# Patient Record
Sex: Male | Born: 2000 | Race: Black or African American | Hispanic: No | Marital: Single | Smoking: Never smoker
Health system: Southern US, Community
[De-identification: ages and names within clinical notes are randomized; demographics above are authoritative.]

## PROBLEM LIST (undated history)

## (undated) DIAGNOSIS — T7840XA Allergy, unspecified, initial encounter: Secondary | ICD-10-CM

## (undated) HISTORY — DX: Allergy, unspecified, initial encounter: T78.40XA

---
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
## 2020-01-10 ENCOUNTER — Ambulatory Visit (INDEPENDENT_AMBULATORY_CARE_PROVIDER_SITE_OTHER): Payer: Commercial Managed Care - PPO

## 2020-01-10 ENCOUNTER — Ambulatory Visit (HOSPITAL_COMMUNITY)
Admission: EM | Admit: 2020-01-10 | Discharge: 2020-01-10 | Disposition: A | Discharge status: Home / Self Care | Payer: Commercial Managed Care - PPO | Attending: Emergency Medicine | Admitting: Emergency Medicine

## 2020-01-10 ENCOUNTER — Other Ambulatory Visit: Payer: Self-pay

## 2020-01-10 ENCOUNTER — Encounter (HOSPITAL_COMMUNITY): Payer: Self-pay

## 2020-01-10 DIAGNOSIS — R079 Chest pain, unspecified: Secondary | ICD-10-CM | POA: Insufficient documentation

## 2020-01-10 DIAGNOSIS — R0789 Other chest pain: Secondary | ICD-10-CM | POA: Diagnosis not present

## 2020-01-10 LAB — TSH: TSH: 1.691 u[IU]/mL (ref 0.350–4.500)

## 2020-01-10 MED ORDER — NAPROXEN 500 MG PO TABS: 500.0000 mg | ORAL_TABLET | Freq: Two times a day (BID) | ORAL | 0 refills | Status: DC

## 2020-01-10 MED ORDER — FAMOTIDINE 20 MG PO TABS: 20.0000 mg | ORAL_TABLET | Freq: Two times a day (BID) | ORAL | 0 refills | Status: DC

## 2020-01-10 NOTE — ED Triage Notes (Signed)
Pt states he has chest discomfort during the day. Pt states his heart beat speeds up at night. This has been going on for 3 weeks or more.

## 2020-01-10 NOTE — ED Provider Notes (Signed)
HPI  SUBJECTIVE:  Kevin Macdonald is a 19 y.o. male who presents with 1 to 2 weeks of constant, daily, deep dull left-sided chest pain.  He reports occasional right-sided chest pain.  He reports palpitations primarily at night when he is lying down.  Occasionally happens during the day.  He describes it as "my heart beating hard".  This can last up to 45 minutes to an hour.  He denies worsening of his chest pain when he has them.  He is never taken his pulse when he is having these palpitations.  Patient is very active, playing soccer, and states that he is able to exercise without any problem.  No shortness of breath, fevers.  No radiation of the pain up his neck, down his arm or through to his back.  No nausea, diaphoresis.  No coughing, wheezing, shortness of breath, abdominal pain.  No lower extremity edema, calf pain, hemoptysis, surgery in the past 4 weeks, recent prolonged immobilization.  No recent viral illness, belching, water brash.  No change in his physical activity.  He has tried raising his arm and massaging the area of pain underneath his pectoral muscle and Vicks VapoRub without improvement in his symptoms.  No aggravating factors.  It is not associated with lying down, bending forward, arm movement, torso rotation.  No trauma to the chest or change in his physical activity.  He has never had symptoms like this before.  Past medical history negative for sickle cell disease, DVT, PE, hypercholesterolemia, diabetes, hypertension, coronary disease, MI, pericarditis, asthma, smoking, pneumothorax, myocarditis, cancer, hypercoagulability, hypothyroidism, anxiety, GERD.  Family history negative for early MI.  PMD: Dr[facility] Jodi Macdonald at [facility].    Past Medical History:  Diagnosis Date  . Allergy     History reviewed. No pertinent surgical history.  History reviewed. No pertinent family history.  Social History   Tobacco Use  . Smoking status: Never Smoker  . Smokeless  tobacco: Never Used  Substance Use Topics  . Alcohol use: No  . Drug use: No    No current facility-administered medications for this encounter.  Current Outpatient Medications:  .  cetirizine (ZYRTEC) 5 MG tablet, GIVE "Kevin Macdonald" 1 TABLET BY MOUTH EVERY DAY, Disp: 30 tablet, Rfl: 3 .  famotidine (PEPCID) 20 MG tablet, Take 1 tablet (20 mg total) by mouth 2 (two) times daily., Disp: 40 tablet, Rfl: 0 .  fluticasone (FLONASE) 50 MCG/ACT nasal spray, Place 2 sprays into both nostrils daily., Disp: 16 g, Rfl: 11 .  naproxen (NAPROSYN) 500 MG tablet, Take 1 tablet (500 mg total) by mouth 2 (two) times daily., Disp: 20 tablet, Rfl: 0  Allergies  Allergen Reactions  . Amoxicillin     unknown     ROS  As noted in HPI.   Physical Exam  BP 126/65 (BP Location: Right Arm)   Pulse 65   Temp 98.7 F (37.1 C)   Resp 16   SpO2 100%   Constitutional: Well developed, well nourished, no acute distress Eyes: PERRL, EOMI, conjunctiva normal bilaterally HENT: Normocephalic, atraumatic,mucus membranes moist Respiratory: Clear to auscultation bilaterally, no rales, no wheezing, no rhonchi Cardiovascular: Normal rate and rhythm, no murmurs, no gallops, no rubs no chest wall tenderness. GI: Soft, nondistended, normal bowel sounds, nontender, no rebound, no guarding no palpable masses skin: No rash, skin intact Musculoskeletal: No lower extremity edema, no calf tenderness bilaterally Endocrine: Normal size nontender thyroid. Neurologic: Alert & oriented x 3, CN III-XII grossly intact, no motor deficits,  sensation grossly intact Psychiatric: Speech and behavior appropriate   ED Course   Medications - No data to display  Orders Placed This Encounter  Procedures  . DG Chest 2 View    Standing Status:   Standing    Number of Occurrences:   1    Order Specific Question:   Reason for Exam (SYMPTOM  OR DIAGNOSIS REQUIRED)    Answer:   left sided CP  . TSH    Standing Status:   Standing     Number of Occurrences:   1  . EKG 12-Lead    Standing Status:   Standing    Number of Occurrences:   1   Results for orders placed or performed during the hospital encounter of 01/10/20 (from the past 24 hour(s))  TSH     Status: None   Collection Time: 01/10/20  5:00 PM  Result Value Ref Range   TSH 1.691 0.350 - 4.500 uIU/mL   DG Chest 2 View  Result Date: 01/10/2020 CLINICAL DATA:  Left chest pain for 1 week. EXAM: CHEST - 2 VIEW COMPARISON:  09/24/2013 FINDINGS: The lungs appear clear.  Cardiac and mediastinal contours normal. No pleural effusion identified. No fracture or acute bony finding identified, note that the left lower ribs are excluded laterally. IMPRESSION: 1.  No significant abnormality identified. 2. Please note that the width of the patient's chest relative to the image is such that small portion of the left lateral lower ribs was excluded. If the patient's left chest pain is lateral to the degree that a rib fracture in this vicinity might be suspected, then repeat radiography to include the entirety of the left lower ribs would be recommended. Electronically Signed   By: Gaylyn Rong M.D.   On: 01/10/2020 17:15    ED Clinical Impression  1. Nonspecific chest pain      ED Assessment/Plan  EKG: Sinus bradycardia, rate 55.  Right axis deviation.  No hypertrophy.  No ST-T wave changes. no Previous EKG for comparison.  Patient symptomatic while EKG was obtained.  We will check chest x-ray to rule out pneumothorax.  Doubt dissection, ACS due to duration of symptoms and the fact that his EKG was normal while he was having symptoms.  He has no other cardiac risk factors.  Modified heart score 0.  Troponin deferred today.  Myocarditis on the differential, but he has been healthy and has had no recent viral illnesses.  No evidence of pericarditis on EKG.  We will send off a TSH because of the reports of palpitations at night.  We will contact patient at [phone] or reach  out through MyChart if it comes back abnormal.  Discussed with him that I would contact him only if abnormal.  Will send home with Naprosyn and Pepcid.  He is to follow-up with his primary care physician in several days if not getting better, gave him strict ER return precautions.  Reviewed imaging independently.  Normal chest x-ray.. See radiology report for full details.  TSH normal.  Plan as above.  Discussed labs, imaging, MDM, treatment plan, and plan for follow-up with patient Discussed sn/sx that should prompt return to the ED. patient agrees with plan.   Meds ordered this encounter  Medications  . naproxen (NAPROSYN) 500 MG tablet    Sig: Take 1 tablet (500 mg total) by mouth 2 (two) times daily.    Dispense:  20 tablet    Refill:  0  . famotidine (PEPCID) 20  MG tablet    Sig: Take 1 tablet (20 mg total) by mouth 2 (two) times daily.    Dispense:  40 tablet    Refill:  0    *This clinic note was created using Scientist, clinical (histocompatibility and immunogenetics). Therefore, there may be occasional mistakes despite careful proofreading.  ?   Domenick Gong, MD 01/10/20 1836

## 2020-01-10 NOTE — Discharge Instructions (Addendum)
Your EKG and chest x-ray were normal.  Your TSH is pending.  I will reach out to you only if it is abnormal.  You will be able to see the result in mychart when it comes out later on tonight.  Try the Pepcid and the Naprosyn see if this helps.  Go immediately to the ER if your chest pain changes, gets worse, or if it goes up your arm, down your neck, through to your back, for the things that we talked about, or for any other concerns.

## 2020-03-04 IMAGING — DX DG CHEST 2V
2 series · 2 of 2 positions shown · non-contrast
Comparison: 09/24/2013

CLINICAL DATA: Left chest pain for 1 week.

EXAM:
CHEST - 2 VIEW

[chest pa]
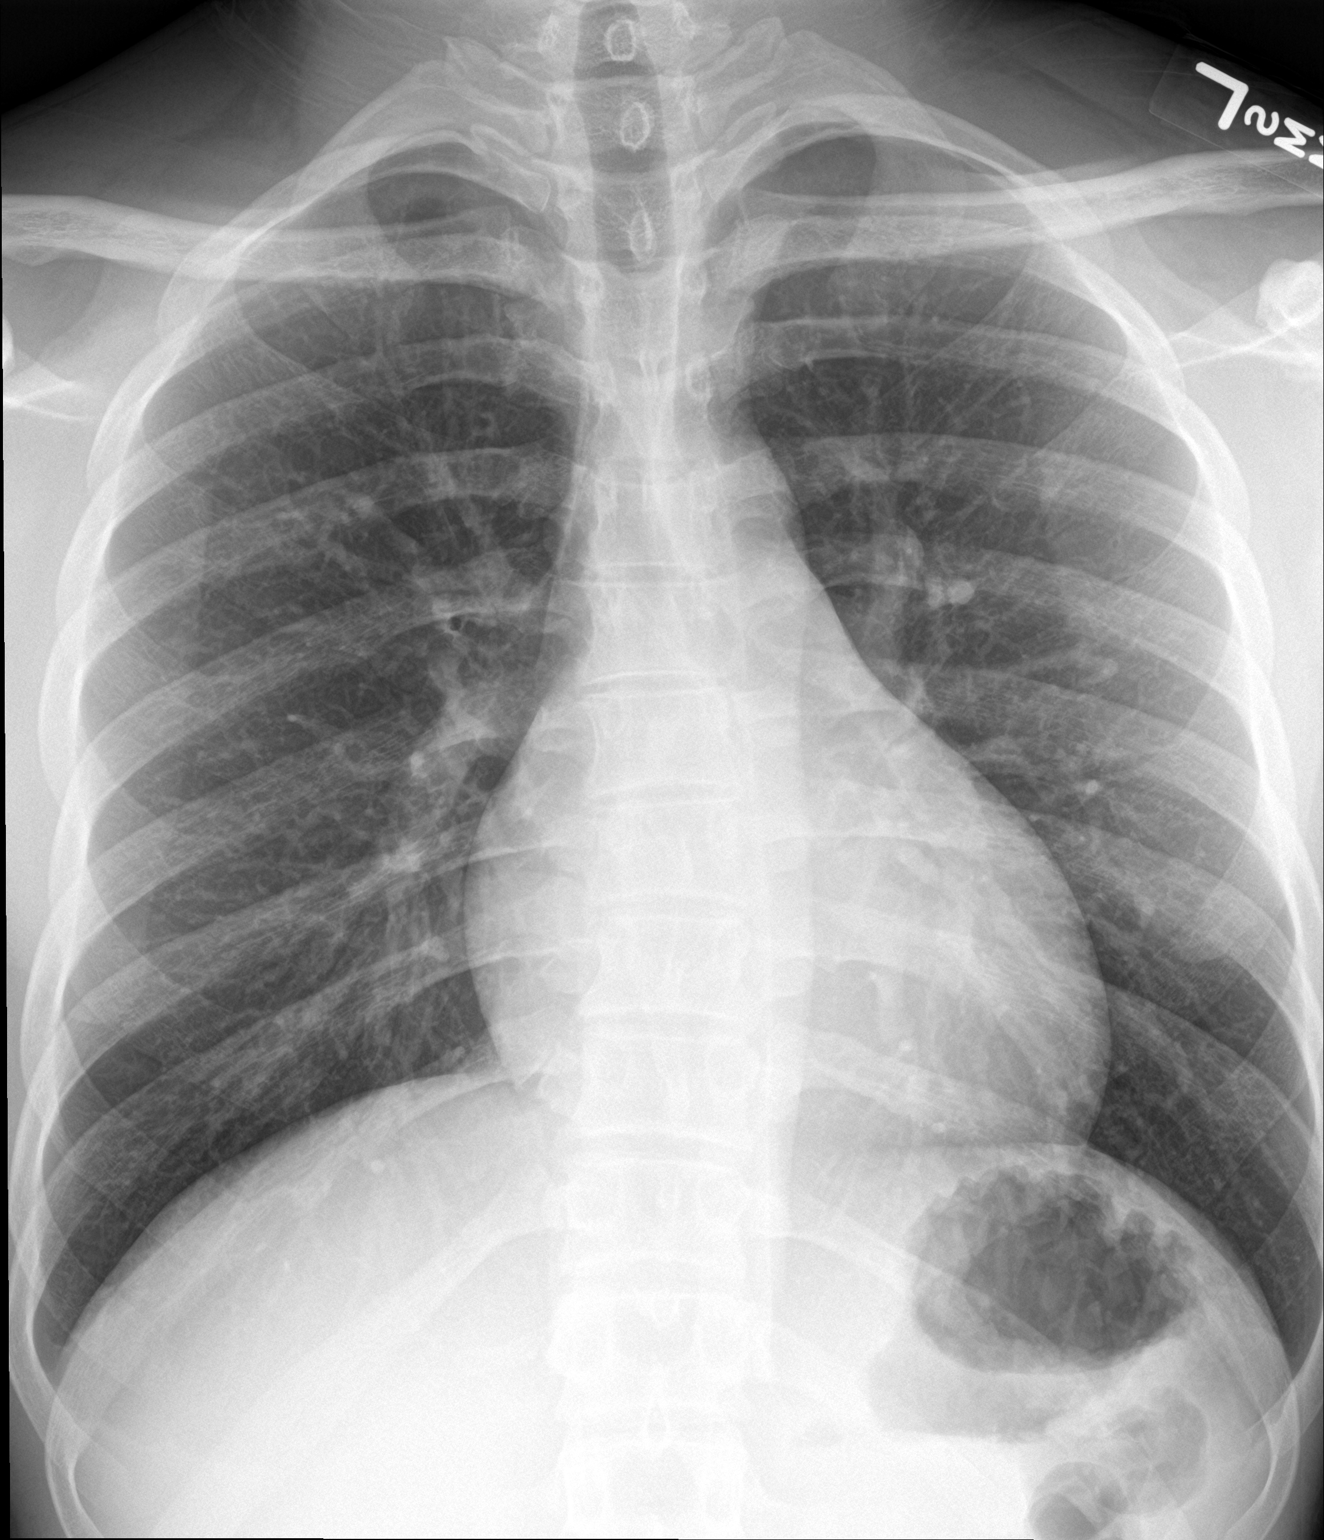

[chest lat]
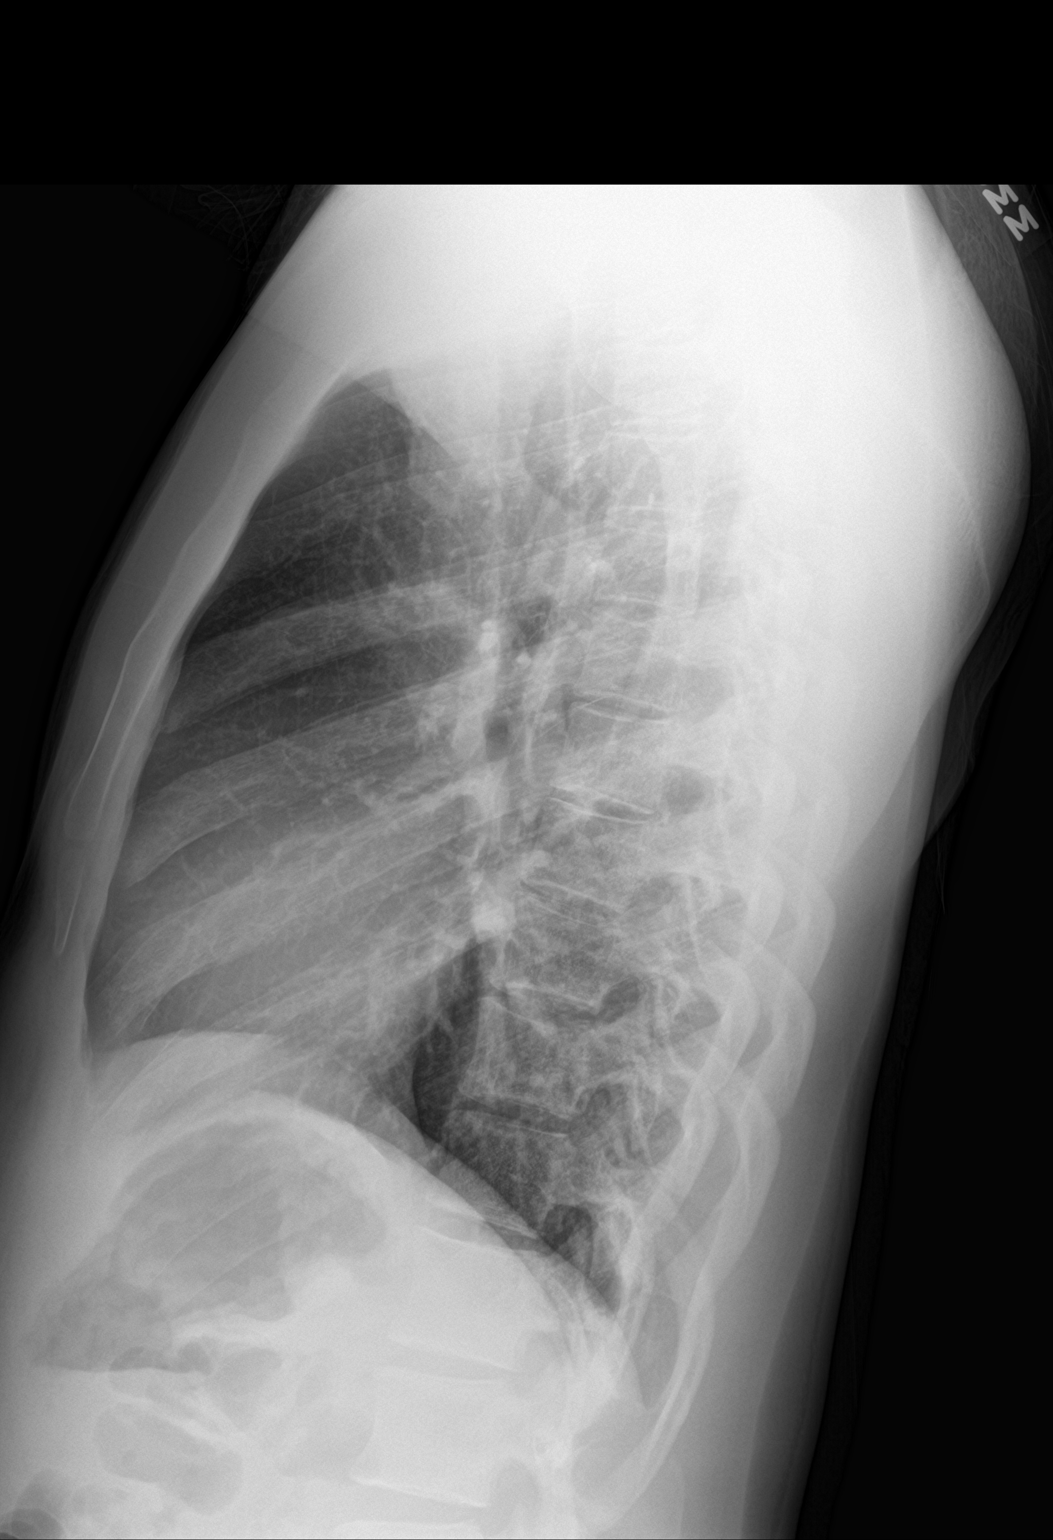

[2 of 2 positions shown; findings below may reference images not displayed]

FINDINGS: The lungs appear clear.  Cardiac and mediastinal contours normal.

No pleural effusion identified. No fracture or acute bony finding
identified, note that the left lower ribs are excluded laterally.
IMPRESSION: 1.  No significant abnormality identified.
2. Please note that the width of the patient's chest relative to the
image is such that small portion of the left lateral lower ribs was
excluded. If the patient's left chest pain is lateral to the degree
that a rib fracture in this vicinity might be suspected, then repeat
radiography to include the entirety of the left lower ribs would be
recommended.

## 2020-03-27 ENCOUNTER — Ambulatory Visit: Payer: Commercial Managed Care - PPO | Attending: Internal Medicine

## 2020-03-27 DIAGNOSIS — Z23 Encounter for immunization: Secondary | ICD-10-CM

## 2020-03-27 NOTE — Progress Notes (Signed)
   Covid-19 Vaccination Clinic  Name:  Damen Windsor    MRN: [id number] DOB: 06-04-2001  03/27/2020  Mr. Rondon was observed post Covid-19 immunization for 15 minutes without incident. He was provided with Vaccine Information Sheet and instruction to access the V-Safe system.   Mr. Landstrom was instructed to call 911 with any severe reactions post vaccine: Marland Kitchen Difficulty breathing  . Swelling of face and throat  . A fast heartbeat  . A bad rash all over body  . Dizziness and weakness   Immunizations Administered    Name Date Dose VIS Date Route   Pfizer COVID-19 Vaccine 03/27/2020 10:03 AM 0.3 mL 01/31/2019 Intramuscular   Manufacturer: [employer], [employer]   Lot: [id number]   NDC: [address]-9

## 2020-04-16 ENCOUNTER — Ambulatory Visit: Payer: Self-pay | Attending: Internal Medicine

## 2020-04-16 DIAGNOSIS — Z23 Encounter for immunization: Secondary | ICD-10-CM

## 2020-04-16 NOTE — Progress Notes (Signed)
   Covid-19 Vaccination Clinic  Name:  Eliza Grissinger    MRN: [id number] DOB: 02/03/2001  04/16/2020  Mr. Espin was observed post Covid-19 immunization for 30 minutes based on pre-vaccination screening without incident. He was provided with Vaccine Information Sheet and instruction to access the V-Safe system.   Mr. Gaughan was instructed to call 911 with any severe reactions post vaccine: Marland Kitchen Difficulty breathing  . Swelling of face and throat  . A fast heartbeat  . A bad rash all over body  . Dizziness and weakness   Immunizations Administered    Name Date Dose VIS Date Route   Pfizer COVID-19 Vaccine 04/16/2020 10:46 AM 0.3 mL 01/31/2019 Intramuscular   Manufacturer: [employer], [employer]   Lot: [id number]   NDC: [address]-5

## 2020-04-17 ENCOUNTER — Ambulatory Visit: Payer: Commercial Managed Care - PPO

## 2020-06-26 ENCOUNTER — Encounter: Payer: Commercial Managed Care - PPO | Admitting: Registered Nurse

## 2020-07-18 ENCOUNTER — Ambulatory Visit (INDEPENDENT_AMBULATORY_CARE_PROVIDER_SITE_OTHER): Payer: Self-pay | Admitting: Family Medicine

## 2020-07-18 ENCOUNTER — Encounter: Payer: Self-pay | Admitting: Family Medicine

## 2020-07-18 ENCOUNTER — Other Ambulatory Visit: Payer: Self-pay

## 2020-07-18 VITALS — BP 134/84 | HR 63 | Temp 98.4°F | Ht 69.0 in | Wt 174.2 lb

## 2020-07-18 DIAGNOSIS — Z025 Encounter for examination for participation in sport: Secondary | ICD-10-CM

## 2020-07-18 DIAGNOSIS — Z0000 Encounter for general adult medical examination without abnormal findings: Secondary | ICD-10-CM

## 2020-07-18 DIAGNOSIS — Z20822 Contact with and (suspected) exposure to covid-19: Secondary | ICD-10-CM

## 2020-07-18 NOTE — Progress Notes (Signed)
Patient ID: Atom Solivan, male    DOB: 2001-09-05  Age: 19 y.o. MRN: [id number]  Chief Complaint  Patient presents with  . 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  He is here for sports physical.  He is studying Firefighter and has done well academically.  He has no physical complaints  Past medical history: Operations: None Major illnesses: None Allergies: None Medications: As needed  Social history: Has an apartment in [place].  His parents are here in [place].  His parents are from [place].  The patient made a trip to [place] this summer.  Review of systems: Constitutional: Unremarkable HEENT: Unremarkable Cardiovascular: Unremarkable Respiratory: Unremarkable Gastrointestinal: Unremarkable Genitourinary: Unremarkable Musculoskeletal: Unremarkable Dermatologic: Unremarkable Psychiatric: Unremarkable Neurologic: Unremarkable Endocrinologic: Unremarkable  Patient has had his Covid shots, in May or June with the [employer] vaccine.  Current allergies, medications, problem list, past/family and social histories reviewed.  Objective:  BP 134/84 (BP Location: Right Arm, Patient Position: Sitting, Cuff Size: Normal)   Pulse 63   Temp 98.4 F (36.9 C) (Temporal)   Ht 5\' 9"  (1.753 m)   Wt 174 lb 3.2 oz (79 kg)   SpO2 96%   BMI 25.72 kg/m   Healthy-appearing young man no acute distress.  TMs normal.  Eyes PERRL.  EOMs EOMs intact.  Throat clear.  Neck supple without nodes thyromegaly.  No carotid bruits.  Chest clear to auscultation.  Heart rate without murmurs, gallops, or arrhythmias.  Abdomen soft without mass or tenderness.  Normal male external genitalia with testes descended and no hernias.  Extremities unremarkable.  Skin unremarkable. Assessment & Plan:   Assessment: 1. Normal exam       Plan: See  instructions.  No orders of the defined types were placed in this encounter.   No orders of the defined types were placed in this encounter.        Patient Instructions     Your sports physical and history examination all appear to be very good.  If you have problems please return.  Continue to seek to be physically, emotionally, relationally, and spiritually healthy.    If you have lab work done today you will be contacted with your lab results within the next 2 weeks.  If you have not heard from then please contact us. The fastest way to get your results is to register for My Chart.   IF you received an x-ray today, you will receive an invoice from [facility]. Please contact [facility] at [phone] with questions or concerns regarding your invoice.   IF you received labwork today, you will receive an invoice from [facility]. Please contact [facility] at [phone] with questions or concerns regarding your invoice.   Our billing staff will not be able to assist you with questions regarding bills from these companies.  You will be contacted with the lab results as soon as they are available. The fastest way to get your results is to activate your My Chart account. Instructions are located on the last page of this paperwork. If you have not heard from [phone] regarding the results in 2 weeks, please contact this office.         Return if symptoms worsen or fail to improve.   [place], MD 07/18/2020

## 2020-07-18 NOTE — Addendum Note (Signed)
Addended by: Krisi Azua H on: 07/18/2020 02:18 PM   Modules accepted: Level of Service

## 2020-07-18 NOTE — Patient Instructions (Addendum)
   Your sports physical and history examination all appear to be very good.  If you have problems please return.  Continue to seek to be physically, emotionally, relationally, and spiritually healthy.    If you have lab work done today you will be contacted with your lab results within the next 2 weeks.  If you have not heard from [place] then please contact us. The fastest way to get your results is to register for My Chart.   IF you received an x-ray today, you will receive an invoice from [facility]. Please contact [facility] at [phone] with questions or concerns regarding your invoice.   IF you received labwork today, you will receive an invoice from [facility]. Please contact [facility] at [phone] with questions or concerns regarding your invoice.   Our billing staff will not be able to assist you with questions regarding bills from these companies.  You will be contacted with the lab results as soon as they are available. The fastest way to get your results is to activate your My Chart account. Instructions are located on the last page of this paperwork. If you have not heard from [place] regarding the results in 2 weeks, please contact this office.

## 2020-07-18 NOTE — Addendum Note (Signed)
Addended by: Kellar Westberg H on: 07/18/2020 02:16 PM   Modules accepted: Orders, Level of Service

## 2020-07-18 NOTE — Progress Notes (Signed)
sport ?

## 2020-07-20 LAB — NOVEL CORONAVIRUS, NAA: SARS-CoV-2, NAA: NOT DETECTED

## 2020-07-20 LAB — SARS-COV-2, NAA 2 DAY TAT

## 2021-07-24 ENCOUNTER — Ambulatory Visit: Payer: Self-pay | Admitting: Family Medicine

## 2021-08-08 ENCOUNTER — Ambulatory Visit: Payer: Self-pay | Admitting: Family Medicine

## 2021-10-06 ENCOUNTER — Ambulatory Visit (INDEPENDENT_AMBULATORY_CARE_PROVIDER_SITE_OTHER): Payer: 59 | Admitting: Family Medicine

## 2021-10-06 ENCOUNTER — Other Ambulatory Visit: Payer: Self-pay

## 2021-10-06 ENCOUNTER — Encounter: Payer: Self-pay | Admitting: Family Medicine

## 2021-10-06 DIAGNOSIS — Z0000 Encounter for general adult medical examination without abnormal findings: Secondary | ICD-10-CM

## 2021-10-06 NOTE — Patient Instructions (Signed)
It was a pleasure to see you today!  Thank you for choosing [facility] for your primary care.   Kevin Macdonald was seen for establishing care.   Our plans for today were: We were able to review your medications and medical history.   To keep you healthy, please keep in mind the following health maintenance items that you are due for:   HPV Vaccine to protect against different forms of cancer that due to a virus that is transmitted via sexual contact.   Please call to schedule for your influenza vaccine.  HIV screening  Hepatitis C screening    You should return to our clinic as needed   Best Wishes,   Dr. Neita Garnet

## 2021-10-06 NOTE — Progress Notes (Signed)
    SUBJECTIVE:   CHIEF COMPLAINT / HPI: establishing care   Patient presents for new patient visit to establish care. He denies any current problems today.   Med Hx  Allergic Rhinitis   Medication Hx  Takes zyrtec as needed   Surgical  Denies surgical hx   Social  Pharmacist, hospital  Once    Fam Hx  None signficiant   PERTINENT  PMH / PSH:  Allergic Rhinitis   OBJECTIVE:   BP 129/72   Pulse 61   Ht 5' 8.5" (1.74 m)   Wt 192 lb 9.6 oz (87.4 kg)   BMI 28.86 kg/m   Physical Exam Constitutional:      General: He is not in acute distress.    Appearance: Normal appearance. He is not ill-appearing, toxic-appearing or diaphoretic.  HENT:     Nose: Nose normal.     Mouth/Throat:     Mouth: Mucous membranes are moist.     Pharynx: No oropharyngeal exudate or posterior oropharyngeal erythema.  Eyes:     Extraocular Movements: Extraocular movements intact.     Conjunctiva/sclera: Conjunctivae normal.     Pupils: Pupils are equal, round, and reactive to light.  Cardiovascular:     Rate and Rhythm: Normal rate and regular rhythm.     Pulses: Normal pulses.     Heart sounds: Normal heart sounds.    No friction rub.  Pulmonary:     Effort: Pulmonary effort is normal.     Breath sounds: Normal breath sounds. No wheezing or rales.  Abdominal:     General: Abdomen is flat. Bowel sounds are normal. There is no distension.     Palpations: Abdomen is soft.     Tenderness: There is no abdominal tenderness.  Musculoskeletal:        General: No swelling, tenderness or deformity. Normal range of motion.     Cervical back: Neck supple.     Right lower leg: No edema.     Left lower leg: No edema.  Skin:    Capillary Refill: Capillary refill takes less than 2 seconds.     Coloration: Skin is not pale.     Findings: No erythema, lesion or rash.  Neurological:     General: No focal deficit present.     Mental Status: He is alert and oriented to person, place, and time.     ASSESSMENT/PLAN:   Healthcare maintenance Reviewed medical, social and surgical hx.  Offered HPV and influenza vaccines, patient declines at this time and would like to schedule RN visit for this prior to receiving.      Ronnald Ramp, MD [facility]

## 2021-10-07 DIAGNOSIS — Z0000 Encounter for general adult medical examination without abnormal findings: Secondary | ICD-10-CM | POA: Insufficient documentation

## 2021-10-07 NOTE — Assessment & Plan Note (Signed)
Reviewed medical, social and surgical hx.  Offered HPV and influenza vaccines, patient declines at this time and would like to schedule RN visit for this prior to receiving.

## 2022-05-12 ENCOUNTER — Encounter: Payer: Self-pay | Admitting: *Deleted

## 2022-07-22 NOTE — Progress Notes (Signed)
    SUBJECTIVE:   Chief compliant/HPI: annual examination  Kevin Macdonald is a 21 y.o. who presents today for an annual exam.   Patient states he occasionally feels short of breath when outdoors x 4 months. States he saw a doctor at his work place and was given an albuterol inhaler to use as needed. States he has used it only 4 times in the last few months. Occasional rhinorrhea. Does not feel symptoms are worse with exercise. Tried Zyrtec in the past with some relief but not currently taking. Denies wheezing and cough. Patient concerned about his persistent weight gain x 2 years. States he is following a calorie deficit diet and exercising regularly. States he feels his "belly is bigger" and he cannot figure out why. States he also has constipation "fairly regularly" and some fatigue. Denies dry skin.   Reviewed and updated history.  Review of systems form notable for: allergic rhinitis   OBJECTIVE:   BP 110/70   Pulse (!) 49   Ht 5\' 9"  (1.753 m)   Wt 202 lb (91.6 kg)   SpO2 98%   BMI 29.83 kg/m     General: NAD, pleasant, able to participate in exam Cardiac: RRR, no murmurs. Respiratory: CTAB, normal effort, no wheezes, rales or rhonchi Abdomen: bowel sounds present, nontender, nondistended Extremities: no edema or cyanosis. Skin: warm and dry, no rashes noted Neuro: alert, no obvious focal deficits Psych: normal affect and mood  ASSESSMENT/PLAN:   Weight gain Gained 30 pounds in the past 2 years despite calorie restricted diet and regular exercise. Ordered TSH, A1C, lipids and CMET to assess.  Allergic rhinitis Patient with occasional SOB when outdoors. Taking albuterol prn and utilized only 4 times in the last few months. Advised to restart cetirizine 10mg  for allergy management.    Annual Examination  Advised patient to continue taking OTC multivitamin that contains vitamin D due to his prior history of vitamin D deficiency. Patient states he recently received  STI screening from his school in 04/2022 and states everything was normal. Advised patient to bring a copy for our records. Declined retesting today.  See AVS for age appropriate recommendations  PHQ score 0, reviewed and discussed.  Blood pressure reviewed and at goal.  Advanced directive: none  Considered the following items based upon USPSTF recommendations: HIV testing: declined Hepatitis C: declined Hepatitis B: declined Syphilis if at high risk: {declined GC/CTdeclined Lipid panel (nonfasting or fasting) discussed based upon AHA recommendations and ordered.  Consider repeat every 4-6 years.  Reviewed risk factors for latent tuberculosis and not indicated Immunizations: given Menveo and TDaP, reviewed vaccinations  Follow up in 1 year or sooner if indicated.    , MD [facility]

## 2022-07-28 ENCOUNTER — Ambulatory Visit (INDEPENDENT_AMBULATORY_CARE_PROVIDER_SITE_OTHER): Payer: Self-pay | Admitting: Family Medicine

## 2022-07-28 ENCOUNTER — Encounter: Payer: Self-pay | Admitting: Family Medicine

## 2022-07-28 VITALS — BP 110/70 | HR 49 | Ht 69.0 in | Wt 202.0 lb

## 2022-07-28 DIAGNOSIS — J3089 Other allergic rhinitis: Secondary | ICD-10-CM

## 2022-07-28 DIAGNOSIS — Z23 Encounter for immunization: Secondary | ICD-10-CM

## 2022-07-28 DIAGNOSIS — R635 Abnormal weight gain: Secondary | ICD-10-CM

## 2022-07-28 LAB — POCT GLYCOSYLATED HEMOGLOBIN (HGB A1C): Hemoglobin A1C: 5.2 % (ref 4.0–5.6)

## 2022-07-28 MED ORDER — CETIRIZINE HCL 10 MG PO TABS: 10.0000 mg | ORAL_TABLET | Freq: Every day | ORAL | 11 refills | Status: AC

## 2022-07-28 NOTE — Assessment & Plan Note (Signed)
Gained 30 pounds in the past 2 years despite calorie restricted diet and regular exercise. Ordered TSH, A1C, lipids and CMET to assess.

## 2022-07-28 NOTE — Patient Instructions (Signed)
It was wonderful to see you today.  Please bring ALL of your medications with you to every visit.   Today we talked about:  Please restart the Zyrtec for allergies and continue taking the inhaler as needed for shortness of breath. If it gets worse please return as we may need to explore other causes. We are doing labs today to assess your weight gain. I will contact you in the coming days to discuss the results. You can send [place] a copy of your records from your school for the Hepatitis C and HIV screening that way we have them on file. Please continue taking your over the counter vitamin D supplement as you have had low vitamin D in the past.  Please follow up in 1 year.  Thank you for choosing [facility].   Please call [phone] with any questions about today's appointment.  Please be sure to schedule follow up at the front  desk before you leave today.   Elberta Fortis, DO Family Medicine

## 2022-07-28 NOTE — Assessment & Plan Note (Addendum)
Patient with occasional SOB when outdoors. Taking albuterol prn and utilized only 4 times in the last few months. Advised to restart cetirizine 10mg  for allergy management.

## 2022-07-29 LAB — COMPREHENSIVE METABOLIC PANEL
ALT: 43 IU/L (ref 0–44)
AST: 33 IU/L (ref 0–40)
Albumin/Globulin Ratio: 1.9 (ref 1.2–2.2)
Albumin: 5 g/dL (ref 4.3–5.2)
Alkaline Phosphatase: 76 IU/L (ref 44–121)
BUN/Creatinine Ratio: 11 (ref 9–20)
BUN: 13 mg/dL (ref 6–20)
Bilirubin Total: 0.8 mg/dL (ref 0.0–1.2)
CO2: 22 mmol/L (ref 20–29)
Calcium: 9.3 mg/dL (ref 8.7–10.2)
Chloride: 100 mmol/L (ref 96–106)
Creatinine, Ser: 1.2 mg/dL (ref 0.76–1.27)
Globulin, Total: 2.6 g/dL (ref 1.5–4.5)
Glucose: 85 mg/dL (ref 70–99)
Potassium: 4.4 mmol/L (ref 3.5–5.2)
Sodium: 140 mmol/L (ref 134–144)
Total Protein: 7.6 g/dL (ref 6.0–8.5)
eGFR: 88 mL/min/{1.73_m2} (ref >59)

## 2022-07-29 LAB — LIPID PANEL
Chol/HDL Ratio: 2.8 ratio (ref 0.0–5.0)
Cholesterol, Total: 204 mg/dL — ABNORMAL HIGH (ref 100–199)
HDL: 73 mg/dL (ref >39)
LDL Chol Calc (NIH): 114 mg/dL — ABNORMAL HIGH (ref 0–99)
Triglycerides: 97 mg/dL (ref 0–149)
VLDL Cholesterol Cal: 17 mg/dL (ref 5–40)

## 2022-07-29 LAB — TSH RFX ON ABNORMAL TO FREE T4: TSH: 2.1 u[IU]/mL (ref 0.450–4.500)

## 2022-07-30 ENCOUNTER — Telehealth: Payer: Self-pay | Admitting: Family Medicine

## 2022-07-30 NOTE — Telephone Encounter (Signed)
Spoke with patient about lab results including mildly elevated total cholesterol and LDL. Recommended dietary changes and offered nutrition counseling to discuss 30lb weight gain over the last two years. Patient would like to discuss with his mother - will send nutrition counseling information through MyChart.  Answered all questions.  Elberta Fortis, DO

## 2023-10-09 ENCOUNTER — Emergency Department
Admission: EM | Admit: 2023-10-09 | Discharge: 2023-10-09 | Disposition: A | Discharge status: Home / Self Care | Payer: BC Managed Care – PPO | Attending: Emergency Medicine | Admitting: Emergency Medicine

## 2023-10-09 ENCOUNTER — Other Ambulatory Visit: Payer: Self-pay

## 2023-10-09 ENCOUNTER — Emergency Department: Payer: BC Managed Care – PPO

## 2023-10-09 DIAGNOSIS — R0789 Other chest pain: Secondary | ICD-10-CM | POA: Insufficient documentation

## 2023-10-09 DIAGNOSIS — R001 Bradycardia, unspecified: Secondary | ICD-10-CM | POA: Diagnosis not present

## 2023-10-09 DIAGNOSIS — R079 Chest pain, unspecified: Secondary | ICD-10-CM | POA: Diagnosis present

## 2023-10-09 DIAGNOSIS — Z1152 Encounter for screening for COVID-19: Secondary | ICD-10-CM | POA: Diagnosis not present

## 2023-10-09 LAB — BASIC METABOLIC PANEL
Anion gap: 6 (ref 5–15)
BUN: 12 mg/dL (ref 6–20)
CO2: 25 mmol/L (ref 22–32)
Calcium: 8.9 mg/dL (ref 8.9–10.3)
Chloride: 108 mmol/L (ref 98–111)
Creatinine, Ser: 1.06 mg/dL (ref 0.61–1.24)
GFR, Estimated: >60 mL/min (ref >60)
Glucose, Bld: 119 mg/dL — ABNORMAL HIGH (ref 70–99)
Potassium: 4 mmol/L (ref 3.5–5.1)
Sodium: 139 mmol/L (ref 135–145)

## 2023-10-09 LAB — RESP PANEL BY RT-PCR (RSV, FLU A&B, COVID)  RVPGX2
Influenza A by PCR: NEGATIVE
Influenza B by PCR: NEGATIVE
Resp Syncytial Virus by PCR: NEGATIVE
SARS Coronavirus 2 by RT PCR: NEGATIVE

## 2023-10-09 LAB — CBC
HCT: 44.8 % (ref 39.0–52.0)
Hemoglobin: 14.8 g/dL (ref 13.0–17.0)
MCH: 29.6 pg (ref 26.0–34.0)
MCHC: 33 g/dL (ref 30.0–36.0)
MCV: 89.6 fL (ref 80.0–100.0)
Platelets: 210 10*3/uL (ref 150–400)
RBC: 5 MIL/uL (ref 4.22–5.81)
RDW: 12.5 % (ref 11.5–15.5)
WBC: 5.1 10*3/uL (ref 4.0–10.5)
nRBC: 0 % (ref 0.0–0.2)

## 2023-10-09 LAB — TROPONIN I (HIGH SENSITIVITY): Troponin I (High Sensitivity): 4 ng/L (ref <18)

## 2023-10-09 MED ORDER — IPRATROPIUM-ALBUTEROL 0.5-2.5 (3) MG/3ML IN SOLN
3.0000 mL | Freq: Once | RESPIRATORY_TRACT | Status: AC
  Administered 2023-10-09: 3 mL via RESPIRATORY_TRACT
  Filled 2023-10-09: qty 3

## 2023-10-09 MED ORDER — ALBUTEROL SULFATE HFA 108 (90 BASE) MCG/ACT IN AERS: 2.0000 | INHALATION_SPRAY | Freq: Four times a day (QID) | RESPIRATORY_TRACT | 0 refills | Status: DC | PRN

## 2023-10-09 NOTE — ED Triage Notes (Signed)
Pt comes with c/o cp for about 8 months on and off. Pt states today it got worse and mom advise to get checked out. Pt states pain mid sternal area and when coughing he hurts in his throat and down chest.

## 2023-10-09 NOTE — ED Notes (Addendum)
Patient declined discharge vital signs. Patient ambulated from ED with a steady gait. No dyspnea noted.

## 2023-10-09 NOTE — ED Provider Notes (Signed)
[facility] Provider Note    Event Date/Time   First MD Initiated Contact with Patient 10/09/23 1345     (approximate)   History   Chief Complaint Chest Pain   HPI  Kevin Macdonald is a 22 y.o. male with no significant past medical history who presents to the ED complaining of chest pain.  Patient reports that he has been dealing with intermittent chest discomfort for about the past 3 months.  He describes a sharp discomfort in the center of his chest that is worse when he goes to cough.  He states he has been feeling this way ever since he "was sick" 3 months ago with fevers, cough, and congestion.  The fevers and congestion have improved and his cough has been nonproductive.  He denies any shortness of breath and has not had any pain or swelling in his legs.     Physical Exam   Triage Vital Signs: ED Triage Vitals  Encounter Vitals Group     BP 10/09/23 1247 121/74     Systolic BP Percentile --      Diastolic BP Percentile --      Pulse Rate 10/09/23 1247 60     Resp 10/09/23 1247 18     Temp 10/09/23 1247 98 F (36.7 C)     Temp src --      SpO2 10/09/23 1247 98 %     Weight 10/09/23 1248 202 lb (91.6 kg)     Height 10/09/23 1248 5\' 10"  (1.778 m)     Head Circumference --      Peak Flow --      Pain Score 10/09/23 1246 4     Pain Loc --      Pain Education --      Exclude from Growth Chart --     Most recent vital signs: Vitals:   10/09/23 1247  BP: 121/74  Pulse: 60  Resp: 18  Temp: 98 F (36.7 C)  SpO2: 98%    Constitutional: Alert and oriented. Eyes: Conjunctivae are normal. Head: Atraumatic. Nose: No congestion/rhinnorhea. Mouth/Throat: Mucous membranes are moist.  Cardiovascular: Normal rate, regular rhythm. Grossly normal heart sounds.  2+ radial pulses bilaterally. Respiratory: Normal respiratory effort.  No retractions. Lungs CTAB.  No chest wall tenderness to palpation. Gastrointestinal: Soft and nontender. No  distention. Musculoskeletal: No lower extremity tenderness nor edema.  Neurologic:  Normal speech and language. No gross focal neurologic deficits are appreciated.    ED Results / Procedures / Treatments   Labs (all labs ordered are listed, but only abnormal results are displayed) Labs Reviewed  BASIC METABOLIC PANEL - Abnormal; Notable for the following components:      Result Value   Glucose, Bld 119 (*)    All other components within normal limits  RESP PANEL BY RT-PCR (RSV, FLU A&B, COVID)  RVPGX2  CBC  TROPONIN I (HIGH SENSITIVITY)     EKG  ED ECG REPORT I, Chesley Noon, the attending physician, personally viewed and interpreted this ECG.   Date: 10/09/2023  EKG Time: 12:51  Rate: 55  Rhythm: sinus bradycardia  Axis: Normal  Intervals:none  ST&T Change: Inferior T wave inversions, possible LVH  RADIOLOGY Chest x-ray reviewed and interpreted by me with no infiltrate, edema, or effusion.  PROCEDURES:  Critical Care performed: No  Procedures   MEDICATIONS ORDERED IN ED: Medications  ipratropium-albuterol (DUONEB) 0.5-2.5 (3) MG/3ML nebulizer solution 3 mL (3 mLs Nebulization Given 10/09/23 1512)  IMPRESSION / MDM / ASSESSMENT AND PLAN / ED COURSE  I reviewed the triage vital signs and the nursing notes.                              22 y.o. male with no significant past medical history presents to the ED with intermittent chest discomfort when coughing for the past 3 months.  Patient's presentation is most consistent with acute presentation with potential threat to life or bodily function.  Differential diagnosis includes, but is not limited to, ACS, PE, pneumonia, pneumothorax, musculoskeletal pain, GERD, bronchospasm, anxiety.  Patient well-appearing and in no acute distress, vital signs are unremarkable.  EKG shows no evidence of arrhythmia or ischemia, does appear concerning for LVH.  Chest x-ray is unremarkable, labs are reassuring with normal  troponin.  I doubt PE as he is PERC negative.  Labs show no significant anemia, leukocytosis, tract abnormality, or AKI.  We will trial DuoNeb and reassess.  Patient reports feeling better following DuoNeb, no active chest pain on reassessment.  He is appropriate for discharge home with outpatient follow-up, will refer to cardiology given findings concerning for LVH on EKG.  He was counseled to return to the ED for new or worsening symptoms.  Patient agrees with plan.      FINAL CLINICAL IMPRESSION(S) / ED DIAGNOSES   Final diagnoses:  Atypical chest pain     Rx / DC Orders   ED Discharge Orders          Ordered    Ambulatory referral to Cardiology        10/09/23 1552    albuterol (VENTOLIN HFA) 108 (90 Base) MCG/ACT inhaler  Every 6 hours PRN       Note to Pharmacy: Please supply with spacer   10/09/23 1552             Note:  This document was prepared using Dragon voice recognition software and may include unintentional dictation errors.   Chesley Noon, MD 10/09/23 [phone]

## 2023-10-15 ENCOUNTER — Telehealth: Payer: Self-pay | Admitting: Emergency Medicine

## 2023-10-15 MED ORDER — AEROCHAMBER MV MISC: 0 refills | Status: AC

## 2023-10-15 MED ORDER — ALBUTEROL SULFATE HFA 108 (90 BASE) MCG/ACT IN AERS: 2.0000 | INHALATION_SPRAY | Freq: Four times a day (QID) | RESPIRATORY_TRACT | 2 refills | Status: DC | PRN

## 2023-10-15 NOTE — Telephone Encounter (Signed)
Resent Rx 

## 2023-11-25 ENCOUNTER — Ambulatory Visit: Payer: BC Managed Care – PPO | Attending: Cardiovascular Disease | Admitting: Cardiovascular Disease

## 2023-11-25 ENCOUNTER — Encounter: Payer: Self-pay | Admitting: Cardiovascular Disease

## 2023-11-25 VITALS — BP 110/70 | HR 52 | Ht 70.0 in | Wt 196.5 lb

## 2023-11-25 DIAGNOSIS — R0789 Other chest pain: Secondary | ICD-10-CM | POA: Diagnosis not present

## 2023-11-25 DIAGNOSIS — R9431 Abnormal electrocardiogram [ECG] [EKG]: Secondary | ICD-10-CM

## 2023-11-25 NOTE — Progress Notes (Signed)
Cardiology Office Note   Date:  11/25/2023   ID:  Kevin Macdonald, DOB [birth year], MRN [id number]  PCP:  Elberta Fortis, MD  Cardiologist:   Lorine Bears, MD   Chief Complaint  Patient presents with   New Patient (Initial Visit)    Ref by [facility] ER; chest pain. Patient c/o chest pain & cough. Medications reviewed by the patient verbally.       History of Present Illness: Kevin Macdonald is a 22 y.o. male who was referred from [facility] ED for evaluation of chest pain and abnormal EKG.  He has no prior cardiac history and is not aware of any congenital heart disease.  He is not a smoker and does not drink alcohol.  No recreational drug use.  No family history of premature coronary artery disease.  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 He had recent symptoms of chest pain which is mostly left-sided under the left axilla.  This was happening mostly when he was having exercise-induced cough.  He is suspected of having allergy induced asthma. He was sent to the emergency department in November for the symptoms.  His troponin was normal and chest x-ray was unremarkable.  His EKG was abnormal with inferior T wave changes.  He is very active and plays soccer on a regular basis with no exertional symptoms whatsoever.    Past Medical History:  Diagnosis Date   Allergy     History reviewed. No pertinent surgical history.   Current Outpatient Medications  Medication Sig Dispense Refill   cetirizine (ZYRTEC) 10 MG tablet Take 1 tablet (10 mg total) by mouth daily. 30 tablet 11   albuterol (VENTOLIN HFA) 108 (90 Base) MCG/ACT inhaler Inhale 2 puffs into the lungs every 6 (six) hours as needed for wheezing or shortness of breath. (Patient not taking: Reported on 11/25/2023) 8 g 0   albuterol (VENTOLIN HFA) 108 (90 Base) MCG/ACT inhaler Inhale 2 puffs into the lungs every 6 (six) hours as needed for wheezing or shortness of breath. (Patient not taking:  Reported on 11/25/2023) 8 g 2   Spacer/Aero-Holding Chambers (AEROCHAMBER MV) inhaler Use as instructed (Patient not taking: Reported on 11/25/2023) 1 each 0   No current facility-administered medications for this visit.    Allergies:   Amoxicillin and Penicillin g    Social History:  The patient  reports that he has never smoked. He has never been exposed to tobacco smoke. He has never used smokeless tobacco. He reports that he does not drink alcohol and does not use drugs.   Family History:  The patient's family history is not on file.    ROS:  Please see the history of present illness.   Otherwise, review of systems are positive for none.   All other systems are reviewed and negative.    PHYSICAL EXAM: VS:  BP 110/70 (BP Location: Right Arm, Patient Position: Sitting, Cuff Size: Normal)   Pulse (!) 52   Ht 5\' 10"  (1.778 m)   Wt 196 lb 8 oz (89.1 kg)   SpO2 98%   BMI 28.19 kg/m  , BMI Body mass index is 28.19 kg/m. GEN: Well nourished, well developed, in no acute distress  HEENT: normal  Neck: no JVD, carotid bruits, or masses Cardiac: RRR; no murmurs, rubs, or gallops,no edema  Respiratory:  clear to auscultation bilaterally, normal work of breathing GI: soft, nontender, nondistended, + BS MS: no deformity or atrophy  Skin:  warm and dry, no rash Neuro:  Strength and sensation are intact Psych: euthymic mood, full affect   EKG:  EKG is ordered today. The ekg ordered today demonstrates : Sinus bradycardia T wave abnormality, consider inferior ischemia When compared with ECG of 09-Oct-2023 12:51, No significant change was found    Recent Labs: 10/09/2023: BUN 12; Creatinine, Ser 1.06; Hemoglobin 14.8; Platelets 210; Potassium 4.0; Sodium 139    Lipid Panel    Component Value Date/Time   CHOL 204 (H) 07/28/2022 1734   TRIG 97 07/28/2022 1734   HDL 73 07/28/2022 1734   CHOLHDL 2.8 07/28/2022 1734   LDLCALC 114 (H) 07/28/2022 1734      Wt Readings from Last 3  Encounters:  11/25/23 196 lb 8 oz (89.1 kg)  10/09/23 202 lb (91.6 kg)  07/28/22 202 lb (91.6 kg)          11/25/2023   11:50 AM  PAD Screen  Previous PAD dx? No  Previous surgical procedure? No  Pain with walking? No  Feet/toe relief with dangling? No  Painful, non-healing ulcers? No  Extremities discolored? No      ASSESSMENT AND PLAN:  1.  Chest pain does not seem to be cardiac.  He is very active and plays soccer on a regular basis with no exertional symptoms.  Thus, no need for stress testing.  His EKG nonetheless is abnormal with inferior T wave changes of unclear etiology.  I requested an echocardiogram to ensure no structural heart abnormalities or congenital heart disease. He can follow-up as needed if he develops exertional symptoms.  2.  Cough: He is thought to have asthma and he reports some improvement in symptoms.    Disposition:   FU with me as needed Signed,  Lorine Bears, MD  11/25/2023 12:09 PM    [facility]

## 2023-11-25 NOTE — Patient Instructions (Signed)
Medication Instructions:  No changes *If you need a refill on your cardiac medications before your next appointment, please call your pharmacy*   Lab Work: None ordered If you have labs (blood work) drawn today and your tests are completely normal, you will receive your results only by: MyChart Message (if you have MyChart) OR A paper copy in the mail If you have any lab test that is abnormal or we need to change your treatment, we will call you to review the results.   Testing/Procedures: Your physician has requested that you have an echocardiogram. Echocardiography is a painless test that uses sound waves to create images of your heart. It provides your doctor with information about the size and shape of your heart and how well your heart's chambers and valves are working.   You may receive an ultrasound enhancing agent through an IV if needed to better visualize your heart during the echo. This procedure takes approximately one hour.  There are no restrictions for this procedure.  This will take place at [address] [facility] [address], [place] [address]  Please note: We ask at that you not bring children with you during ultrasound (echo/ vascular) testing. Due to room size and safety concerns, children are not allowed in the ultrasound rooms during exams. Our front office staff cannot provide observation of children in our lobby area while testing is being conducted. An adult accompanying a patient to their appointment will only be allowed in the ultrasound room at the discretion of the ultrasound technician under special circumstances. We apologize for any inconvenience.    Follow-Up: At [facility], you and your health needs are our priority.  As part of our continuing mission to provide you with exceptional heart care, we have created designated Provider Care Teams.  These Care Teams include your primary Cardiologist (physician) and Advanced Practice  Providers (APPs -  Physician Assistants and Nurse Practitioners) who all work together to provide you with the care you need, when you need it.  We recommend signing up for the patient portal called "MyChart".  Sign up information is provided on this After Visit Summary.  MyChart is used to connect with patients for Virtual Visits (Telemedicine).  Patients are able to view lab/test results, encounter notes, upcoming appointments, etc.  Non-urgent messages can be sent to your provider as well.   To learn more about what you can do with MyChart, go to [email or web address].    Your next appointment:   Follow up as needed

## 2023-12-16 ENCOUNTER — Other Ambulatory Visit: Payer: Self-pay | Admitting: Cardiovascular Disease

## 2023-12-16 DIAGNOSIS — R9431 Abnormal electrocardiogram [ECG] [EKG]: Secondary | ICD-10-CM

## 2023-12-16 DIAGNOSIS — R0789 Other chest pain: Secondary | ICD-10-CM

## 2023-12-17 ENCOUNTER — Ambulatory Visit: Payer: BC Managed Care – PPO

## 2024-02-16 ENCOUNTER — Ambulatory Visit: Payer: BC Managed Care – PPO | Attending: Cardiovascular Disease

## 2024-02-16 DIAGNOSIS — R9431 Abnormal electrocardiogram [ECG] [EKG]: Secondary | ICD-10-CM | POA: Diagnosis not present

## 2024-02-16 DIAGNOSIS — R0789 Other chest pain: Secondary | ICD-10-CM

## 2024-02-16 LAB — ECHOCARDIOGRAM COMPLETE
AV Mean grad: 4 mmHg
AV Peak grad: 7.6 mmHg
Ao pk vel: 1.38 m/s
Area-P 1/2: 3.21 cm2
S' Lateral: 3.2 cm

## 2024-06-12 ENCOUNTER — Encounter (HOSPITAL_COMMUNITY): Payer: Self-pay

## 2024-06-12 ENCOUNTER — Ambulatory Visit (HOSPITAL_COMMUNITY)
Admission: EM | Admit: 2024-06-12 | Discharge: 2024-06-12 | Disposition: A | Discharge status: Home / Self Care | Attending: Family Medicine | Admitting: Family Medicine

## 2024-06-12 DIAGNOSIS — H00033 Abscess of eyelid right eye, unspecified eyelid: Secondary | ICD-10-CM | POA: Diagnosis not present

## 2024-06-12 MED ORDER — GENTAMICIN SULFATE 0.3 % OP SOLN: 2.0000 [drp] | Freq: Three times a day (TID) | OPHTHALMIC | 0 refills | Status: AC

## 2024-06-12 MED ORDER — CLINDAMYCIN HCL 300 MG PO CAPS
300.0000 mg | ORAL_CAPSULE | Freq: Three times a day (TID) | ORAL | 0 refills | Status: AC
Start: 2024-06-12 — End: 2024-06-19

## 2024-06-12 NOTE — ED Triage Notes (Signed)
 Patient presents to the office for right eye swelling and pain that started last night. Denies any trauma to his right eye.

## 2024-06-12 NOTE — ED Provider Notes (Signed)
 [facility]    CSN: [id number] Arrival date & time: 06/12/24  1550      History   Chief Complaint No chief complaint on file.   HPI Kevin Macdonald is a 23 y.o. male.   HPI Here for pain and swelling of his right eyelids.  It began about 3 days ago and initially was giving him some discomfort adjacent to the right bridge of his nose.  Then the swelling of his eyelid started today.  There has been some tearing and a little bit of dried discharge.  He is allergic to penicillins which causes anaphylaxis.   Past Medical History:  Diagnosis Date   Allergy     Patient Active Problem List   Diagnosis Date Noted   Weight gain 07/28/2022   Healthcare maintenance 10/07/2021   Sports physical 07/28/2017   Routine general medical examination at a health care facility 07/28/2017   Myopia of both eyes 05/26/2016   Subareolar gynecomastia in male 06/16/2013   Allergic rhinitis 04/19/2008    History reviewed. No pertinent surgical history.     Home Medications    Prior to Admission medications   Medication Sig Start Date End Date Taking? Authorizing Provider  clindamycin  (CLEOCIN ) 300 MG capsule Take 1 capsule (300 mg total) by mouth 3 (three) times daily for 7 days. 06/12/24 06/19/24 Yes Vonna Sharlet POUR, MD  gentamicin  (GARAMYCIN ) 0.3 % ophthalmic solution Place 2 drops into the right eye 3 (three) times daily for 5 days. 06/12/24 06/17/24 Yes Reef Achterberg, Sharlet POUR, MD  albuterol  (VENTOLIN  HFA) 108 (90 Base) MCG/ACT inhaler Inhale 2 puffs into the lungs every 6 (six) hours as needed for wheezing or shortness of breath. Patient not taking: Reported on 11/25/2023 10/09/23   Willo Dunnings, MD  albuterol  (VENTOLIN  HFA) 108 [id number] Base) MCG/ACT inhaler Inhale 2 puffs into the lungs every 6 (six) hours as needed for wheezing or shortness of breath. Patient not taking: Reported on 11/25/2023 10/15/23   Bradler, Evan K, MD  cetirizine  (ZYRTEC ) 10 MG tablet Take 1 tablet (10 mg  total) by mouth daily. 07/28/22   Theophilus Pagan, MD  Spacer/Aero-Holding Chambers (AEROCHAMBER MV) inhaler Use as instructed Patient not taking: Reported on 11/25/2023 10/15/23   Jossie Artist POUR, MD    Family History History reviewed. No pertinent family history.  Social History Social History   Tobacco Use   Smoking status: Never    Passive exposure: Never   Smokeless tobacco: Never  Vaping Use   Vaping status: Never Used  Substance Use Topics   Alcohol use: No   Drug use: No     Allergies   Amoxicillin and Penicillin g   Review of Systems Review of Systems   Physical Exam Triage Vital Signs ED Triage Vitals  Encounter Vitals Group     BP 06/12/24 1609 (!) 146/76     Girls Systolic BP Percentile --      Girls Diastolic BP Percentile --      Boys Systolic BP Percentile --      Boys Diastolic BP Percentile --      Pulse Rate 06/12/24 1609 (!) 56     Resp 06/12/24 1609 20     Temp 06/12/24 1609 98.2 F (36.8 C)     Temp Source 06/12/24 1609 Oral     SpO2 06/12/24 1609 98 %     Weight --      Height --      Head Circumference --  Peak Flow --      Pain Score 06/12/24 1612 8     Pain Loc --      Pain Education --      Exclude from Growth Chart --    No data found.  Updated Vital Signs BP (!) 146/76 (BP Location: Left Arm)   Pulse (!) 56   Temp 98.2 F (36.8 C) (Oral)   Resp 20   SpO2 98%   Visual Acuity Right Eye Distance: 20/50 Left Eye Distance: 20/50 Bilateral Distance: 20/50  Right Eye Near: R Near: 20/50 Left Eye Near:  L Near: 20/50 Bilateral Near:  20/50 (Wears glasees not available doing visual acuity.)  Physical Exam Vitals reviewed.  Constitutional:      General: He is not in acute distress.    Appearance: He is not ill-appearing, toxic-appearing or diaphoretic.  HENT:     Nose: Nose normal.     Mouth/Throat:     Mouth: Mucous membranes are moist.  Eyes:     Extraocular Movements: Extraocular movements intact.     Pupils:  Pupils are equal, round, and reactive to light.     Comments: The right upper and lower eyelids are swollen some and pink.  There is some tenderness and a little induration of the right side of the ridge of the nose also.  There is a little bit of tearing evident.  There may be some very very slight injection of the conjunctiva.  Skin:    Coloration: Skin is not pale.  Neurological:     General: No focal deficit present.     Mental Status: He is alert and oriented to person, place, and time.      UC Treatments / Results  Labs (all labs ordered are listed, but only abnormal results are displayed) Labs Reviewed - No data to display  EKG   Radiology No results found.  Procedures Procedures (including critical care time)  Medications Ordered in UC Medications - No data to display  Initial Impression / Assessment and Plan / UC Course  I have reviewed the triage vital signs and the nursing notes.  Pertinent labs & imaging results that were available during my care of the patient were reviewed by me and considered in my medical decision making (see chart for details).     Clindamycin  is sent in to treat what appears to be cellulitis of the eyelids and I am still going to send in also some gentamicin  for possible conjunctivitis.  He states ibuprofen  is adequately relieving his pain, so he will continue to take that. Final Clinical Impressions(s) / UC Diagnoses   Final diagnoses:  Cellulitis of right eyelid     Discharge Instructions      --Take clindamycin  300 mg-- 1 capsule 3 times daily for 7 days  Put gentamicin  eyedrops in the affected eye(s) 3 times daily for 5 days.  You can continue to take ibuprofen  as needed for the discomfort.     ED Prescriptions     Medication Sig Dispense Auth. Provider   clindamycin  (CLEOCIN ) 300 MG capsule Take 1 capsule (300 mg total) by mouth 3 (three) times daily for 7 days. 21 capsule Vonna Sharlet POUR, MD   gentamicin   (GARAMYCIN ) 0.3 % ophthalmic solution Place 2 drops into the right eye 3 (three) times daily for 5 days. 5 mL Vonna Sharlet POUR, MD      PDMP not reviewed this encounter.   Vonna Sharlet POUR, MD 06/12/24 [phone]

## 2024-06-12 NOTE — Discharge Instructions (Signed)
--  Take clindamycin  300 mg-- 1 capsule 3 times daily for 7 days  Put gentamicin  eyedrops in the affected eye(s) 3 times daily for 5 days.  You can continue to take ibuprofen  as needed for the discomfort.

## 2024-09-27 ENCOUNTER — Ambulatory Visit (HOSPITAL_COMMUNITY): Admission: EM | Admit: 2024-09-27 | Discharge: 2024-09-27 | Disposition: A | Discharge status: Home / Self Care

## 2024-09-27 ENCOUNTER — Ambulatory Visit (INDEPENDENT_AMBULATORY_CARE_PROVIDER_SITE_OTHER)

## 2024-09-27 DIAGNOSIS — R051 Acute cough: Secondary | ICD-10-CM

## 2024-09-27 DIAGNOSIS — R053 Chronic cough: Secondary | ICD-10-CM | POA: Diagnosis not present

## 2024-09-27 MED ORDER — ALBUTEROL SULFATE HFA 108 (90 BASE) MCG/ACT IN AERS
2.0000 | INHALATION_SPRAY | Freq: Four times a day (QID) | RESPIRATORY_TRACT | 0 refills | Status: AC | PRN
Start: 2024-09-27 — End: ?

## 2024-09-27 MED ORDER — BENZONATATE 100 MG PO CAPS: 100.0000 mg | ORAL_CAPSULE | Freq: Three times a day (TID) | ORAL | 0 refills | Status: AC

## 2024-09-27 NOTE — ED Provider Notes (Signed)
 [facility]    CSN: [id number] Arrival date & time: 09/27/24  1548      History   Chief Complaint Chief Complaint  Patient presents with   Cough    HPI Kevin Macdonald is a 23 y.o. male.   Patient presents today due to persistent dry cough for the last 2 months.  Patient denies fever, chills, nausea, vomiting, or change in appetite.  Patient states Zyrtec  daily for symptoms but denies any other medications.  Patient states that he is coughing more at night and has coughing fits to the point that he feels like he could vomit.  Patient states that his mother told him that he used to have asthma when he was younger but after spending a year in [place] and coming back to the states that he had no issues with asthma.  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  Patient is wanting to be given a refill on albuterol  today.   Cough   Past Medical History:  Diagnosis Date   Allergy     Patient Active Problem List   Diagnosis Date Noted   Weight gain 07/28/2022   Healthcare maintenance 10/07/2021   Sports physical 07/28/2017   Routine general medical examination at a health care facility 07/28/2017   Myopia of both eyes 05/26/2016   Subareolar gynecomastia in male 06/16/2013   Allergic rhinitis 04/19/2008    No past surgical history on file.     Home Medications    Prior to Admission medications   Medication Sig Start Date End Date Taking? Authorizing Provider  albuterol  (VENTOLIN  HFA) 108 (90 Base) MCG/ACT inhaler Inhale 2 puffs into the lungs every 6 (six) hours as needed for wheezing or shortness of breath. 09/27/24   Andra Corean BROCKS, PA-C  benzonatate (TESSALON) 100 MG capsule Take 1 capsule (100 mg total) by mouth every 8 (eight) hours. 09/27/24  Yes Andra Corean BROCKS, PA-C  cetirizine  (ZYRTEC ) 10 MG tablet Take 1 tablet (10 mg total)  by mouth daily. 07/28/22   Theophilus Pagan, MD  Spacer/Aero-Holding Chambers (AEROCHAMBER MV) inhaler Use as instructed Patient not taking: Reported on 11/25/2023 10/15/23   Jossie Artist POUR, MD    Family History No family history on file.  Social History Social History   Tobacco Use   Smoking status: Never    Passive exposure: Never   Smokeless tobacco: Never  Vaping Use   Vaping status: Never Used  Substance Use Topics   Alcohol use: No   Drug use: No     Allergies   Amoxicillin and Penicillin g   Review of Systems Review of Systems  Respiratory:  Positive for cough.      Physical Exam Triage Vital Signs ED Triage Vitals  Encounter Vitals Group     BP 09/27/24 1703 124/67     Girls Systolic BP Percentile --      Girls Diastolic BP Percentile --      Boys Systolic BP Percentile --      Boys Diastolic BP Percentile --      Pulse Rate 09/27/24 1703 (!) 50     Resp 09/27/24 1703 18     Temp 09/27/24 1703 98.2 F (36.8 C)     Temp Source 09/27/24 1703 Oral     SpO2 09/27/24 1703 99 %     Weight --  Height --      Head Circumference --      Peak Flow --      Pain Score 09/27/24 1706 0     Pain Loc --      Pain Education --      Exclude from Growth Chart --    No data found.  Updated Vital Signs BP 124/67 (BP Location: Left Arm)   Pulse (!) 50   Temp 98.2 F (36.8 C) (Oral)   Resp 18   SpO2 99%   Visual Acuity Right Eye Distance:   Left Eye Distance:   Bilateral Distance:    Right Eye Near:   Left Eye Near:    Bilateral Near:     Physical Exam Vitals and nursing note reviewed.  Constitutional:      General: He is not in acute distress.    Appearance: Normal appearance. He is not ill-appearing, toxic-appearing or diaphoretic.  Eyes:     General: No scleral icterus. Cardiovascular:     Rate and Rhythm: Normal rate and regular rhythm.     Heart sounds: Normal heart sounds.  Pulmonary:     Effort: Pulmonary effort is normal. No  respiratory distress.     Breath sounds: Normal breath sounds. No wheezing or rhonchi.  Skin:    General: Skin is warm.  Neurological:     Mental Status: He is alert and oriented to person, place, and time.  Psychiatric:        Mood and Affect: Mood normal.        Behavior: Behavior normal.      UC Treatments / Results  Labs (all labs ordered are listed, but only abnormal results are displayed) Labs Reviewed - No data to display  EKG   Radiology DG Chest 2 View Result Date: 09/27/2024 CLINICAL DATA:  Productive cough for 2 months. EXAM: CHEST - 2 VIEW COMPARISON:  Chest x-ray 10/09/2019 for FINDINGS: The cardiac silhouette, mediastinal and hilar contours are normal. The lungs are clear. No infiltrates, edema or effusions. No findings to suggest bronchitis. The bony thorax is intact. IMPRESSION: No acute cardiopulmonary findings. Electronically Signed   By: MYRTIS Stammer M.D.   On: 09/27/2024 17:51    Procedures Procedures (including critical care time)  Medications Ordered in UC Medications - No data to display  Initial Impression / Assessment and Plan / UC Course  I have reviewed the triage vital signs and the nursing notes.  Pertinent labs & imaging results that were available during my care of the patient were reviewed by me and considered in my medical decision making (see chart for details).     Chest x-ray appears within normal limits, prescribed Tessalon and albuterol  for patient's persistent cough. Final Clinical Impressions(s) / UC Diagnoses   Final diagnoses:  Acute cough  Persistent cough     Discharge Instructions      Preliminary review of x-ray shows acute abnormalities, will call if radiologist over read reveals something different.     ED Prescriptions     Medication Sig Dispense Auth. Provider   benzonatate (TESSALON) 100 MG capsule Take 1 capsule (100 mg total) by mouth every 8 (eight) hours. 21 capsule Andra Krabbe C, PA-C    albuterol  (VENTOLIN  HFA) 108 (90 Base) MCG/ACT inhaler Inhale 2 puffs into the lungs every 6 (six) hours as needed for wheezing or shortness of breath. 8 g Andra Krabbe BROCKS, PA-C      PDMP not reviewed this encounter.   Andra,  Corean BROCKS, PA-C 09/27/24 1756

## 2024-09-27 NOTE — Discharge Instructions (Signed)
 Preliminary review of x-ray shows acute abnormalities, will call if radiologist over read reveals something different.

## 2024-09-27 NOTE — ED Triage Notes (Signed)
 Patient presents to the office for productive cough x 2 month. Patient states he has a cough when the seasons change. Patient has use an inhaler in the past. Has zyrtec  but does not take any medication.
